# Patient Record
Sex: Male | Born: 2000 | Hispanic: Yes | Marital: Single
Health system: Southern US, Community
[De-identification: ages and names within clinical notes are randomized; demographics above are authoritative.]

---

## 2019-11-29 ENCOUNTER — Emergency Department (HOSPITAL_COMMUNITY)
Admission: EM | Admit: 2019-11-29 | Discharge: 2019-11-29 | Disposition: A | Discharge status: Home / Self Care | Payer: Self-pay | Attending: Emergency Medicine | Admitting: Emergency Medicine

## 2019-11-29 ENCOUNTER — Other Ambulatory Visit: Payer: Self-pay

## 2019-11-29 ENCOUNTER — Encounter (HOSPITAL_COMMUNITY): Payer: Self-pay

## 2019-11-29 ENCOUNTER — Emergency Department (HOSPITAL_COMMUNITY): Payer: Self-pay

## 2019-11-29 DIAGNOSIS — Y9289 Other specified places as the place of occurrence of the external cause: Secondary | ICD-10-CM | POA: Insufficient documentation

## 2019-11-29 DIAGNOSIS — F419 Anxiety disorder, unspecified: Secondary | ICD-10-CM | POA: Insufficient documentation

## 2019-11-29 DIAGNOSIS — S8991XA Unspecified injury of right lower leg, initial encounter: Secondary | ICD-10-CM

## 2019-11-29 DIAGNOSIS — S81841A Puncture wound with foreign body, right lower leg, initial encounter: Secondary | ICD-10-CM | POA: Insufficient documentation

## 2019-11-29 DIAGNOSIS — Z1810 Retained metal fragments, unspecified: Secondary | ICD-10-CM | POA: Insufficient documentation

## 2019-11-29 DIAGNOSIS — W458XXA Other foreign body or object entering through skin, initial encounter: Secondary | ICD-10-CM | POA: Insufficient documentation

## 2019-11-29 DIAGNOSIS — Y990 Civilian activity done for income or pay: Secondary | ICD-10-CM | POA: Insufficient documentation

## 2019-11-29 DIAGNOSIS — Y93H9 Activity, other involving exterior property and land maintenance, building and construction: Secondary | ICD-10-CM | POA: Insufficient documentation

## 2019-11-29 DIAGNOSIS — Z23 Encounter for immunization: Secondary | ICD-10-CM | POA: Insufficient documentation

## 2019-11-29 MED ORDER — TETANUS-DIPHTH-ACELL PERTUSSIS 5-2.5-18.5 LF-MCG/0.5 IM SUSP
0.5000 mL | Freq: Once | INTRAMUSCULAR | Status: AC
  Administered 2019-11-29: 0.5 mL via INTRAMUSCULAR
  Filled 2019-11-29: qty 0.5

## 2019-11-29 MED ORDER — HYDROCODONE-ACETAMINOPHEN 5-325 MG PO TABS
1.0000 | ORAL_TABLET | Freq: Once | ORAL | Status: AC
  Administered 2019-11-29: 1 via ORAL
  Filled 2019-11-29: qty 1

## 2019-11-29 NOTE — Discharge Instructions (Signed)
You have been seen today for leg injury. Please read and follow all provided instructions. Return to the emergency room for worsening condition or new concerning symptoms including  signs of infection: surrounding redness, red streaking, pus draining, fever or chills.  1. Medications:  You can take Tylenol or ibuprofen for pain as needed.  Please take as directed on the bottle.   2. Treatment: Wash the wound twice daily and after working with soap and water.  You can apply back to tracing or Neosporin and a Band-Aid as needed.  3. Follow Up:  Please follow up with primary care provider by scheduling an appointment as soon as possible for a visit  If you do not have a primary care physician, contact [facility] at [phone] for referral   ?

## 2019-11-29 NOTE — ED Triage Notes (Signed)
Pt came POV from work, stated about an hour and a half ago a stapler slipped and struck him in the right shin. Pain and blood noted only to that area. PMS present. VS stable and noted.

## 2019-11-29 NOTE — ED Notes (Signed)
Patient verbalizes understanding of discharge instructions. Opportunity for questioning and answers were provided. Armband removed by staff, pt discharged from ED.  

## 2019-11-29 NOTE — ED Notes (Signed)
One staple noted in pts right shin, staple remover at bedside

## 2019-11-29 NOTE — ED Provider Notes (Signed)
[facility] EMERGENCY DEPARTMENT Provider Note   CSN: [id number] Arrival date & time: 11/29/19  8756     History Chief Complaint  Patient presents with  . Leg Injury    [facility] is a 18 y.o. male with no known past medical history presents to emergency department today with chief complaint of right leg injury.  Onset was acute having just prior to arrival.  Patient states he was at work using a staple gun when he accidentally shot a staple into his right shin.  He has pain site of the injury that radiates down his leg.  Pain is worse with movement and bearing weight.  He rates the pain 10 of 10 in severity.  He did not take anything for pain prior to arrival.  He denies falling, hitting his head, loss of consciousness.  Also denies fever, chills, numbness, weakness, tingling, decreased sensation. He is not anticoagulated.  Unsure of last tetanus immunization. History provided by patient with additional history obtained from chart review.     History reviewed. No pertinent past medical history.  There are no problems to display for this patient.        No family history on file.  Social History   Tobacco Use  . Smoking status: Not on file  Substance Use Topics  . Alcohol use: Not on file  . Drug use: Not on file    Home Medications Prior to Admission medications   Not on File    Allergies    Patient has no known allergies.  Review of Systems   Review of Systems  All other systems are reviewed and are negative for acute change except as noted in the HPI.   Physical Exam Updated Vital Signs BP 131/74 (BP Location: Right Arm)   Pulse 79   Temp 97.9 F (36.6 C) (Oral)   Resp 16   SpO2 98%   Physical Exam Vitals and nursing note reviewed.  Constitutional:      Appearance: He is well-developed. He is not ill-appearing or toxic-appearing.  HENT:     Head: Normocephalic and atraumatic.     Nose: Nose normal.  Eyes:     General: No  scleral icterus.       Right eye: No discharge.        Left eye: No discharge.     Conjunctiva/sclera: Conjunctivae normal.  Neck:     Vascular: No JVD.  Cardiovascular:     Rate and Rhythm: Normal rate and regular rhythm.     Pulses: Normal pulses.     Heart sounds: Normal heart sounds.  Pulmonary:     Effort: Pulmonary effort is normal.     Breath sounds: Normal breath sounds.  Abdominal:     General: There is no distension.  Musculoskeletal:        General: Normal range of motion.     Cervical back: Normal range of motion.       Legs:     Comments: Sensation intact in right shin. Decreased ROM of right knee secondary to pain. Full ROM of right ankle. DP pulse 2+ on right. Brisk cap refill. Able to wiggle toes. Ambulates   Skin:    General: Skin is warm and dry.  Neurological:     Mental Status: He is oriented to person, place, and time.     GCS: GCS eye subscore is 4. GCS verbal subscore is 5. GCS motor subscore is 6.     Comments: Fluent  speech, no facial droop.  Psychiatric:        Behavior: Behavior normal.       ED Results / Procedures / Treatments   Labs (all labs ordered are listed, but only abnormal results are displayed) Labs Reviewed - No data to display  EKG None  Radiology DG Tibia/Fibula Right  Result Date: 11/29/2019 CLINICAL DATA:  Stable gun stable in leg. EXAM: RIGHT TIBIA AND FIBULA - 2 VIEW COMPARISON:  No prior. FINDINGS: Stable is noted over the mid anterior tib-fib region. This stable abuts the tibia. No fracture noted. No other focal abnormality identified. IMPRESSION: Staples noted over the mid anterior tib-fib region. This stable opposed tibia. Electronically Signed   By: Marcello Moores  Register   On: 11/29/2019 09:49    Procedures .Foreign Body Removal  Date/Time: 11/29/2019 10:22 AM Performed by: Cherre Robins, PA-C Authorized by: Cherre Robins, PA-C  Consent: Verbal consent obtained. Risks and benefits: risks, benefits and  alternatives were discussed Consent given by: patient Patient understanding: patient states understanding of the procedure being performed Test results: test results available and properly labeled Site marked: the operative site was marked Imaging studies: imaging studies available Required items: required blood products, implants, devices, and special equipment available Patient identity confirmed: verbally with patient and arm band Time out: Immediately prior to procedure a "time out" was called to verify the correct patient, procedure, equipment, support staff and site/side marked as required. Intake: right leg.  Sedation: Patient sedated: no  Patient restrained: no Complexity: simple 1 objects recovered. Objects recovered: large construction staple Post-procedure assessment: foreign body removed Patient tolerance: patient tolerated the procedure well with no immediate complications   (including critical care time)  Medications Ordered in ED Medications  HYDROcodone-acetaminophen (NORCO/VICODIN) 5-325 MG per tablet 1 tablet (1 tablet Oral Given 11/29/19 0951)  Tdap (BOOSTRIX) injection 0.5 mL (0.5 mLs Intramuscular Given 11/29/19 0951)    ED Course  I have reviewed the triage vital signs and the nursing notes.  Pertinent labs & imaging results that were available during my care of the patient were reviewed by me and considered in my medical decision making (see chart for details).    MDM Rules/Calculators/A&P                      Patient seen and examined.  He is well-appearing, no acute distress.  He does appear anxious.  He has a Social research officer, government size staple through his right anterior shin.  No active bleeding.  Sensation is intact.  He has brisk cap refill and is able to wiggle all toes. X-ray of right tib-fib viewed by me without signs of fracture.  Staple is noted to abut the tib-fib.  Patient given dose of PO pain medication and staple was removed using hemostats.   Patient tolerated well.  Please see procedure note above.  I thoroughly irrigated wound, applied back situation and dressing.  Patient has full range of motion of right knee and ankle after staple removal and pain has significantly improved. Patient neurovascularly intact after removal.   Discussed wound care and signs of infection to watch for.  Tetanus updated at today's ED visit. The patient appears reasonably screened and/or stabilized for discharge and I doubt any other medical condition or other [facility] requiring further screening, evaluation, or treatment in the ED at this time prior to discharge. The patient is safe for discharge with strict return precautions discussed. Recommend pcp follow up for wound check if needed.  Portions of this note were generated with Scientist, clinical (histocompatibility and immunogenetics)Dragon dictation software. Dictation errors may occur despite best attempts at proofreading.    Final Clinical Impression(s) / ED Diagnoses Final diagnoses:  Leg injury, right, initial encounter    Rx / DC Orders ED Discharge Orders    None       Kathyrn Lasslbrizze, Vonita Calloway E, PA-C 11/29/19 1033    Gwyneth SproutPlunkett, Whitney, MD 11/30/19 [phone]

## 2020-12-05 IMAGING — DX DG TIBIA/FIBULA 2V*R*
5 series · 5 of 5 positions shown · non-contrast
Comparison: No prior.

CLINICAL DATA: Stable gun stable in leg.

EXAM:
RIGHT TIBIA AND FIBULA - 2 VIEW

[x tib-fib ap right (1 of 2)]
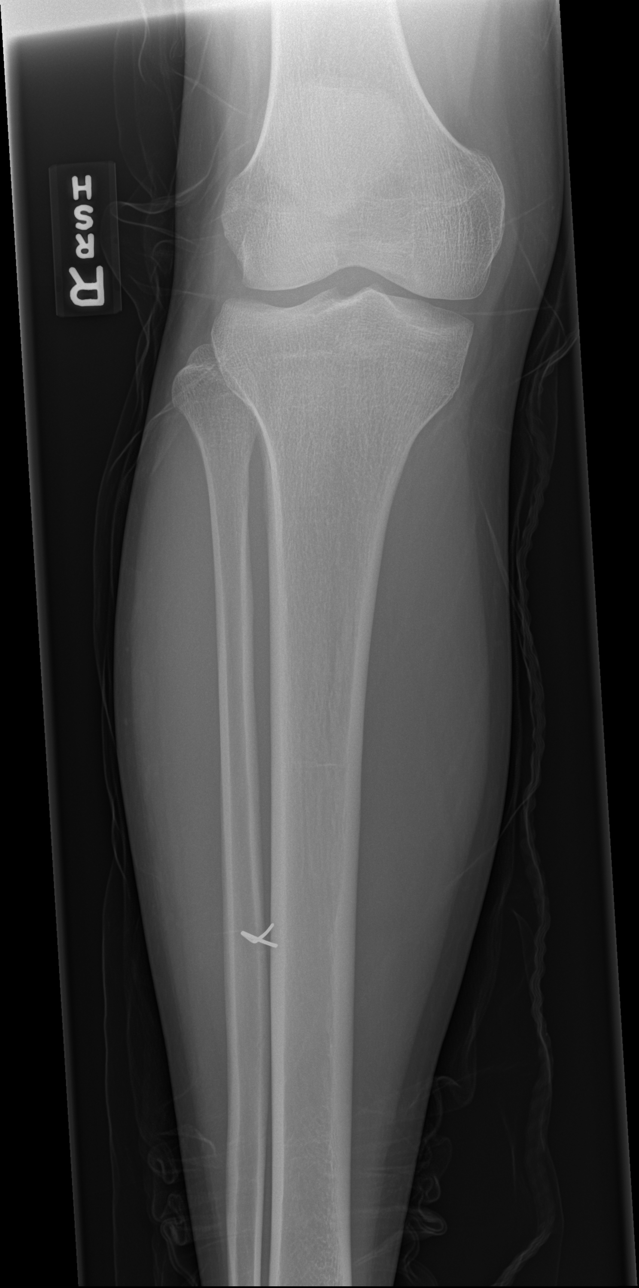

[x tib-fib lat right (1 of 3)]
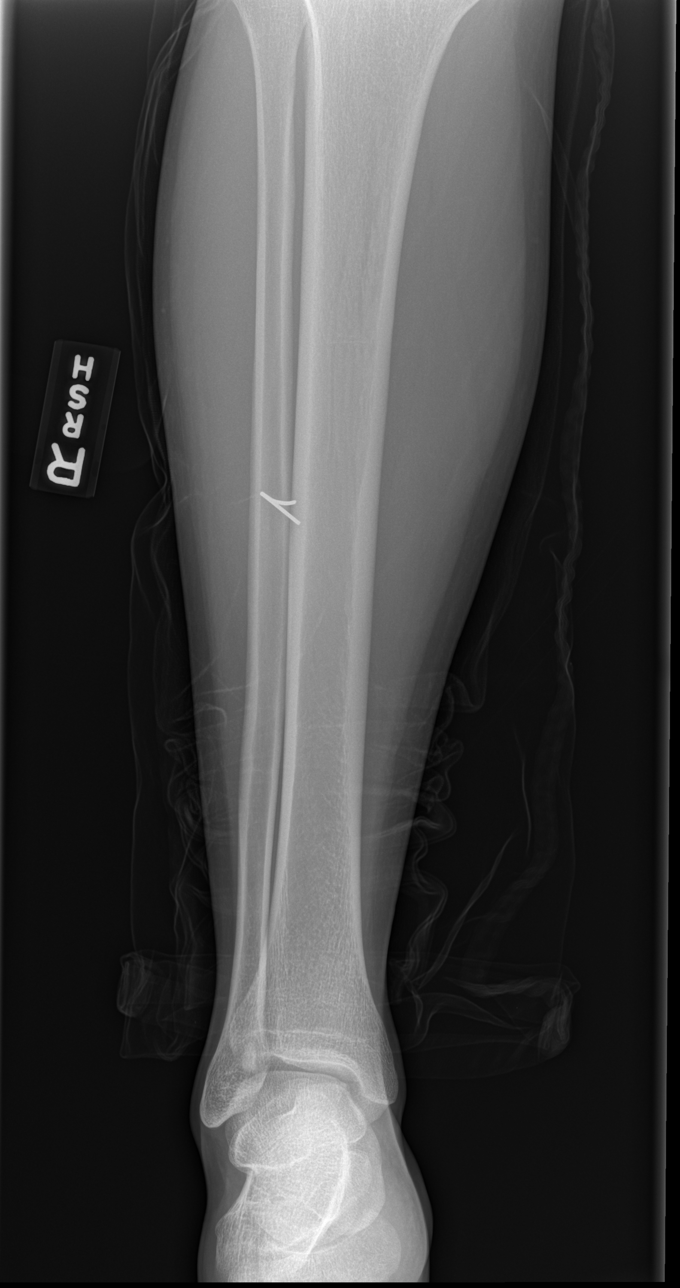

[x tib-fib ap right (2 of 2)]
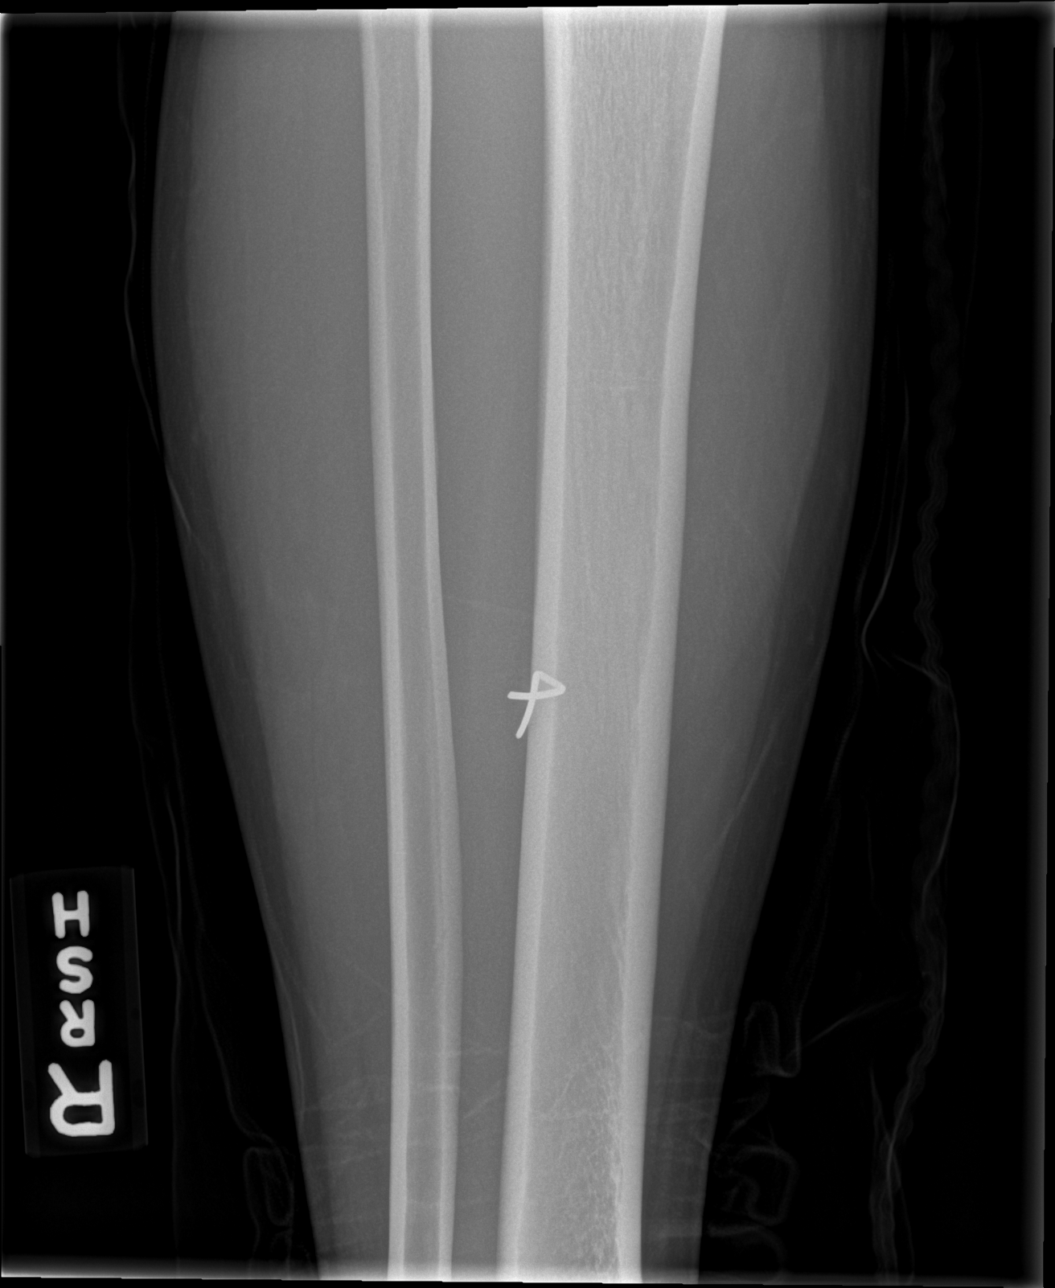

[x tib-fib lat right (2 of 3)]
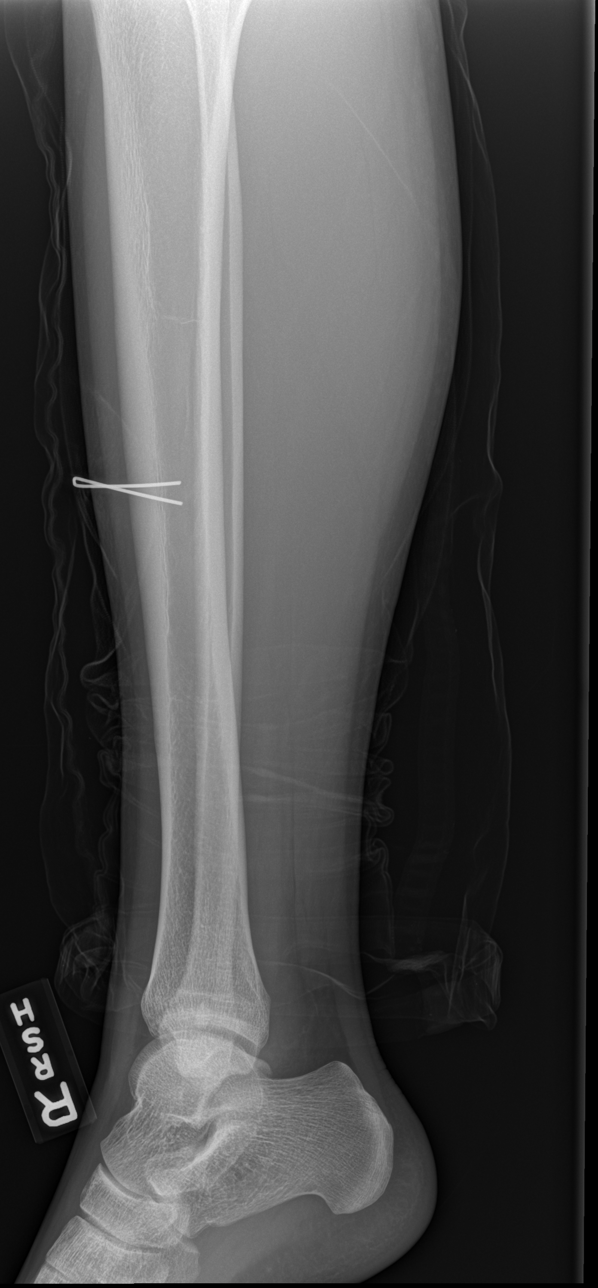

[x tib-fib lat right (3 of 3)]
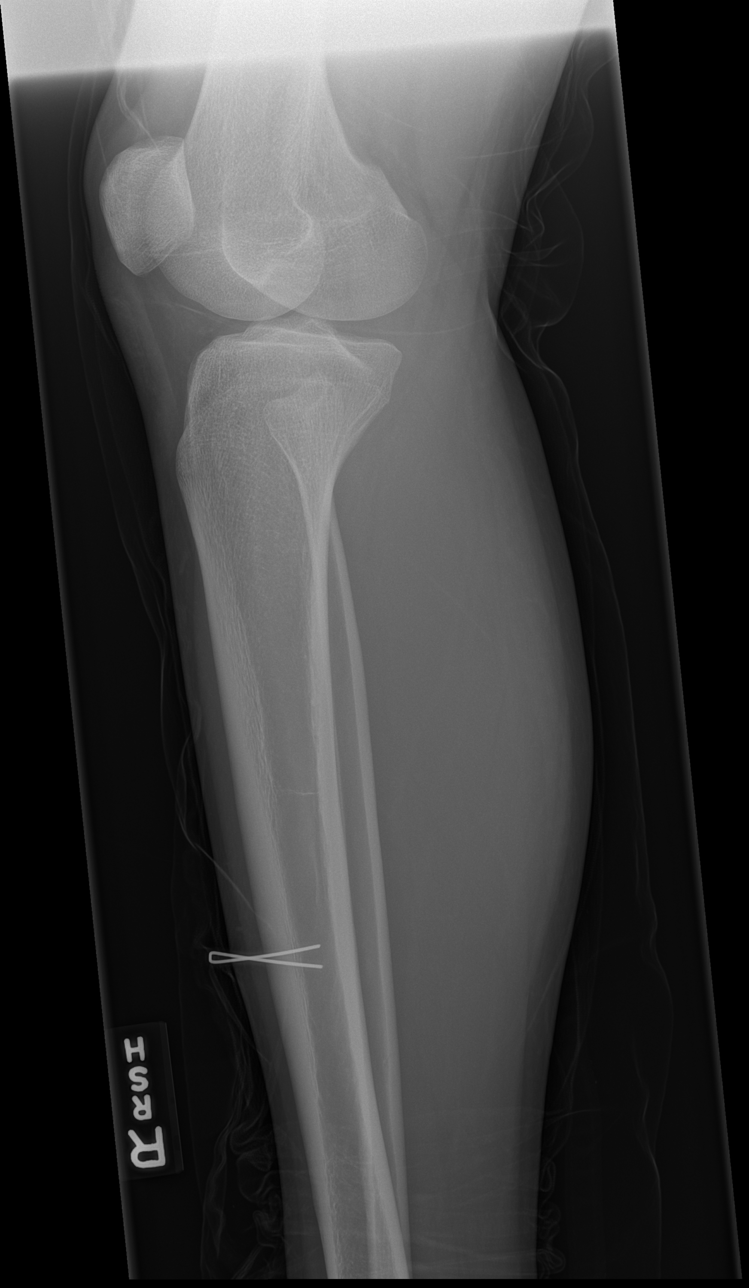

[5 of 5 positions shown; findings below may reference images not displayed]

FINDINGS: Stable is noted over the mid anterior tib-fib region. This stable
abuts the tibia. No fracture noted. No other focal abnormality
identified.
IMPRESSION: Staples noted over the mid anterior tib-fib region. This stable
opposed tibia.
